# Patient Record
Sex: Female | Born: 2000 | Race: Black or African American | Hispanic: No | Marital: Single | Smoking: Never smoker
Health system: Southern US, Community
[De-identification: ages and names within clinical notes are randomized; demographics above are authoritative.]

## PROBLEM LIST (undated history)

## (undated) DIAGNOSIS — K589 Irritable bowel syndrome without diarrhea: Secondary | ICD-10-CM

---
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
## 2019-02-15 ENCOUNTER — Ambulatory Visit: Payer: Medicaid Other | Attending: Internal Medicine

## 2019-02-15 DIAGNOSIS — Z20822 Contact with and (suspected) exposure to covid-19: Secondary | ICD-10-CM

## 2019-02-17 LAB — NOVEL CORONAVIRUS, NAA: SARS-CoV-2, NAA: NOT DETECTED

## 2019-06-22 ENCOUNTER — Ambulatory Visit: Payer: Medicaid Other | Attending: Internal Medicine

## 2019-06-22 DIAGNOSIS — Z20822 Contact with and (suspected) exposure to covid-19: Secondary | ICD-10-CM

## 2019-06-23 LAB — SARS-COV-2, NAA 2 DAY TAT

## 2019-06-23 LAB — NOVEL CORONAVIRUS, NAA: SARS-CoV-2, NAA: NOT DETECTED

## 2023-05-20 ENCOUNTER — Ambulatory Visit: Admitting: Family Medicine

## 2023-05-25 ENCOUNTER — Ambulatory Visit: Payer: Self-pay | Admitting: Podiatry
